# Patient Record
Sex: Female | Born: 1995 | Hispanic: Yes | Marital: Single | Smoking: Never smoker
Health system: Southern US, Community
[De-identification: ages and names within clinical notes are randomized; demographics above are authoritative.]

## PROBLEM LIST (undated history)

## (undated) DIAGNOSIS — J45909 Unspecified asthma, uncomplicated: Secondary | ICD-10-CM

---

## 2020-09-06 ENCOUNTER — Encounter: Payer: Self-pay | Admitting: Emergency Medicine

## 2020-09-06 ENCOUNTER — Emergency Department: Payer: No Typology Code available for payment source

## 2020-09-06 ENCOUNTER — Emergency Department
Admission: EM | Admit: 2020-09-06 | Discharge: 2020-09-06 | Disposition: A | Discharge status: Home / Self Care | Payer: No Typology Code available for payment source | Attending: Emergency Medicine | Admitting: Emergency Medicine

## 2020-09-06 ENCOUNTER — Other Ambulatory Visit: Payer: Self-pay

## 2020-09-06 DIAGNOSIS — J45909 Unspecified asthma, uncomplicated: Secondary | ICD-10-CM | POA: Diagnosis not present

## 2020-09-06 DIAGNOSIS — Y92410 Unspecified street and highway as the place of occurrence of the external cause: Secondary | ICD-10-CM | POA: Insufficient documentation

## 2020-09-06 DIAGNOSIS — M542 Cervicalgia: Secondary | ICD-10-CM | POA: Insufficient documentation

## 2020-09-06 DIAGNOSIS — M546 Pain in thoracic spine: Secondary | ICD-10-CM | POA: Insufficient documentation

## 2020-09-06 HISTORY — DX: Unspecified asthma, uncomplicated: J45.909

## 2020-09-06 MED ORDER — MELOXICAM 15 MG PO TABS: 15.0000 mg | ORAL_TABLET | Freq: Every day | ORAL | 2 refills | Status: AC

## 2020-09-06 MED ORDER — METHOCARBAMOL 500 MG PO TABS: 500.0000 mg | ORAL_TABLET | Freq: Three times a day (TID) | ORAL | 0 refills | Status: AC | PRN

## 2020-09-06 MED ORDER — KETOROLAC TROMETHAMINE 30 MG/ML IJ SOLN
30.0000 mg | Freq: Once | INTRAMUSCULAR | Status: AC
  Administered 2020-09-06: 30 mg via INTRAMUSCULAR
  Filled 2020-09-06: qty 1

## 2020-09-06 NOTE — Discharge Instructions (Signed)
Take meloxicam and Robaxin as directed. 
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
